# Patient Record
Sex: Male | Born: 1985 | Race: Black or African American | Hispanic: No | Marital: Single | Smoking: Never smoker
Health system: Southern US, Community
[De-identification: ages and names within clinical notes are randomized; demographics above are authoritative.]

## PROBLEM LIST (undated history)

## (undated) DIAGNOSIS — J45909 Unspecified asthma, uncomplicated: Secondary | ICD-10-CM

---

## 2015-01-08 ENCOUNTER — Encounter (HOSPITAL_COMMUNITY): Payer: Self-pay

## 2015-01-08 ENCOUNTER — Emergency Department (HOSPITAL_COMMUNITY)
Admission: EM | Admit: 2015-01-08 | Discharge: 2015-01-08 | Disposition: A | Discharge status: Home / Self Care | Payer: Medicaid - Out of State | Attending: Emergency Medicine | Admitting: Emergency Medicine

## 2015-01-08 ENCOUNTER — Emergency Department (HOSPITAL_COMMUNITY): Payer: Medicaid - Out of State

## 2015-01-08 DIAGNOSIS — R1031 Right lower quadrant pain: Secondary | ICD-10-CM | POA: Diagnosis present

## 2015-01-08 DIAGNOSIS — N451 Epididymitis: Secondary | ICD-10-CM

## 2015-01-08 DIAGNOSIS — J45909 Unspecified asthma, uncomplicated: Secondary | ICD-10-CM | POA: Diagnosis not present

## 2015-01-08 HISTORY — DX: Unspecified asthma, uncomplicated: J45.909

## 2015-01-08 LAB — URINALYSIS, ROUTINE W REFLEX MICROSCOPIC
Bilirubin Urine: NEGATIVE
GLUCOSE, UA: NEGATIVE mg/dL
HGB URINE DIPSTICK: NEGATIVE
Ketones, ur: NEGATIVE mg/dL
LEUKOCYTES UA: NEGATIVE
Nitrite: NEGATIVE
PH: 5.5 (ref 5.0–8.0)
PROTEIN: NEGATIVE mg/dL
SPECIFIC GRAVITY, URINE: 1.017 (ref 1.005–1.030)
Urobilinogen, UA: 1 mg/dL (ref 0.0–1.0)

## 2015-01-08 MED ORDER — IBUPROFEN 800 MG PO TABS: 800.0000 mg | ORAL_TABLET | Freq: Three times a day (TID) | ORAL | Status: AC

## 2015-01-08 MED ORDER — LIDOCAINE HCL (PF) 1 % IJ SOLN
INTRAMUSCULAR | Status: AC
  Filled 2015-01-08: qty 5

## 2015-01-08 MED ORDER — OXYCODONE-ACETAMINOPHEN 5-325 MG PO TABS
1.0000 | ORAL_TABLET | Freq: Once | ORAL | Status: AC
  Administered 2015-01-08: 1 via ORAL
  Filled 2015-01-08: qty 1

## 2015-01-08 MED ORDER — CEFTRIAXONE SODIUM 250 MG IJ SOLR
250.0000 mg | Freq: Once | INTRAMUSCULAR | Status: AC
  Administered 2015-01-08: 250 mg via INTRAMUSCULAR
  Filled 2015-01-08: qty 250

## 2015-01-08 MED ORDER — HYDROCODONE-ACETAMINOPHEN 5-325 MG PO TABS: 2.0000 | ORAL_TABLET | ORAL | Status: DC | PRN

## 2015-01-08 MED ORDER — DOXYCYCLINE HYCLATE 100 MG PO CAPS: 100.0000 mg | ORAL_CAPSULE | Freq: Two times a day (BID) | ORAL | Status: AC

## 2015-01-08 NOTE — ED Notes (Signed)
Pt in Ultrasound

## 2015-01-08 NOTE — Discharge Instructions (Signed)
Epididymitis °Epididymitis is swelling (inflammation) of the epididymis. The epididymis is a cord-like structure that is located along the top and back part of the testicle. It collects and stores sperm from the testicle. °This condition can also cause pain and swelling of the testicle and scrotum. Symptoms usually start suddenly (acute epididymitis). Sometimes epididymitis starts gradually and lasts for a while (chronic epididymitis). This type may be harder to treat. °CAUSES °In men 35 and younger, this condition is usually caused by a bacterial infection or sexually transmitted disease (STD), such as: °· Gonorrhea. °· Chlamydia.   °In men 35 and older who do not have anal sex, this condition is usually caused by bacteria from a blockage or abnormalities in the urinary system. These can result from: °· Having a tube placed into the bladder (urinary catheter). °· Having an enlarged or inflamed prostate gland. °· Having recent urinary tract surgery. °In men who have a condition that weakens the body's defense system (immune system), such as HIV, this condition can be caused by:  °· Other bacteria, including tuberculosis and syphilis. °· Viruses. °· Fungi. °Sometimes this condition occurs without infection. That may happen if urine flows backward into the epididymis after heavy lifting or straining. °RISK FACTORS °This condition is more likely to develop in men: °· Who have unprotected sex with more than one partner. °· Who have anal sex.   °· Who have recently had surgery.   °· Who have a urinary catheter. °· Who have urinary problems. °· Who have a suppressed immune system.   °SYMPTOMS  °This condition usually begins suddenly with chills, fever, and pain behind the scrotum and in the testicle. Other symptoms include:  °· Swelling of the scrotum, testicle, or both. °· Pain when ejaculating or urinating. °· Pain in the back or belly. °· Nausea. °· Itching and discharge from the penis. °· Frequent need to pass  urine. °· Redness and tenderness of the scrotum. °DIAGNOSIS °Your health care provider can diagnose this condition based on your symptoms and medical history. Your health care provider will also do a physical exam to ask about your symptoms and check your scrotum and testicle for swelling, pain, and redness. You may also have other tests, including:   °· Examination of discharge from the penis. °· Urine tests for infections, such as STDs.   °Your health care provider may test you for other STDs, including HIV.  °TREATMENT °Treatment for this condition depends on the cause. If your condition is caused by a bacterial infection, oral antibiotic medicine may be prescribed. If the bacterial infection has spread to your blood, you may need to receive IV antibiotics. Nonbacterial epididymitis is treated with home care that includes bed rest and elevation of the scrotum. °Surgery may be needed to treat: °· Bacterial epididymitis that causes pus to build up in the scrotum (abscess). °· Chronic epididymitis that has not responded to other treatments. °HOME CARE INSTRUCTIONS °Medicines  °· Take over-the-counter and prescription medicines only as told by your health care provider.   °· If you were prescribed an antibiotic medicine, take it as told by your health care provider. Do not stop taking the antibiotic even if your condition improves. °Sexual Activity  °· If your epididymitis was caused by an STD, avoid sexual activity until your treatment is complete. °· Inform your sexual partner or partners if you test positive for an STD. They may need to be treated. Do not engage in sexual activity with your partner or partners until their treatment is completed. °General Instructions  °· Return to your normal activities as told   by your health care provider. Ask your health care provider what activities are safe for you. °· Keep your scrotum elevated and supported while resting. Ask your health care provider if you should wear a  scrotal support, such as a jockstrap. Wear it as told by your health care provider. °· If directed, apply ice to the affected area:   °¨ Put ice in a plastic bag. °¨ Place a towel between your skin and the bag. °¨ Leave the ice on for 20 minutes, 2-3 times per day. °· Try taking a sitz bath to help with discomfort. This is a warm water bath that is taken while you are sitting down. The water should only come up to your hips and should cover your buttocks. Do this 3-4 times per day or as told by your health care provider. °· Keep all follow-up visits as told by your health care provider. This is important. °SEEK MEDICAL CARE IF:  °· You have a fever.   °· Your pain medicine is not helping.   °· Your pain is getting worse.   °· Your symptoms do not improve within three days. °  °This information is not intended to replace advice given to you by your health care provider. Make sure you discuss any questions you have with your health care provider. °  °Document Released: 02/18/2000 Document Revised: 11/11/2014 Document Reviewed: 07/08/2014 °Elsevier Interactive Patient Education ©2016 [employer] ° °

## 2015-01-08 NOTE — ED Notes (Signed)
Pt reports groin pain since yesterday. Pain originates at lower mid abdomen and radiates down to his groin. Pt denies hematuria or dysuria.

## 2015-01-08 NOTE — ED Provider Notes (Signed)
CSN: [id number]     Arrival date & time 01/08/15  0015 History   By signing my name below, I, Jarvis Morganaylor Ferguson, attest that this documentation has been prepared under the direction and in the presence of Gilda Creasehristopher J Geraldina Parrott, MD. Electronically Signed: Jarvis Morganaylor Ferguson, ED Scribe. 01/08/2015. 3:32 AM.    Chief Complaint  Patient presents with  . Groin Pain    The history is provided by the patient. No language interpreter was used.    HPI Comments: Steven Gaines is a 29 y.o. male who presents to the Emergency Department complaining of intermittent,  Moderate, right sided groin pain onset 2 days. Pt reports associated right testicular pain. He has not taken any medications PTA. Pt reports he feels relief with urination.  Pt notes he had a similar pain 3-4 years ago and went to an ER while in [place] and imaging done which showed no acute findings. He denies any nausea, vomiting, diarrhea, constipation, dysuria, hematuria, urinary frequency, urinary urgency, testicular swelling, penile pain or penile discharge.   Past Medical History  Diagnosis Date  . Asthma    History reviewed. No pertinent past surgical history. No family history on file. Social History  Substance Use Topics  . Smoking status: Never Smoker   . Smokeless tobacco: None  . Alcohol Use: No    Review of Systems  Gastrointestinal: Negative for vomiting, diarrhea and constipation.  Genitourinary: Positive for testicular pain. Negative for dysuria, urgency, frequency, hematuria, discharge, penile swelling, scrotal swelling, difficulty urinating and penile pain.  All other systems reviewed and are negative.     Allergies  Review of patient's allergies indicates no known allergies.  Home Medications   Prior to Admission medications   Not on File   Triage Vitals: BP 135/77 mmHg  Pulse 63  Temp(Src) 97.8 F (36.6 C) (Oral)  Resp 18  Ht 5\' 8"  (1.727 m)  Wt 147 lb (66.679 kg)  BMI 22.36 kg/m2  SpO2 96%  Physical  Exam  Constitutional: He is oriented to person, place, and time. He appears well-developed and well-nourished. No distress.  HENT:  Head: Normocephalic and atraumatic.  Right Ear: Hearing normal.  Left Ear: Hearing normal.  Nose: Nose normal.  Mouth/Throat: Oropharynx is clear and moist and mucous membranes are normal.  Eyes: Conjunctivae and EOM are normal. Pupils are equal, round, and reactive to light.  Neck: Normal range of motion. Neck supple.  Cardiovascular: Regular rhythm, S1 normal and S2 normal.  Exam reveals no gallop and no friction rub.   No murmur heard. Pulmonary/Chest: Effort normal and breath sounds normal. No respiratory distress. He exhibits no tenderness.  Abdominal: Soft. Normal appearance and bowel sounds are normal. There is no hepatosplenomegaly. There is no tenderness. There is no rebound, no guarding, no tenderness at McBurney's point and negative Murphy's sign. No hernia.  Genitourinary: Right testis shows tenderness. Right testis shows no mass and no swelling.  Tenderness of upper pole of right testicle w/o mass or swelling  Musculoskeletal: Normal range of motion.  Neurological: He is alert and oriented to person, place, and time. He has normal strength. No cranial nerve deficit or sensory deficit. Coordination normal. GCS eye subscore is 4. GCS verbal subscore is 5. GCS motor subscore is 6.  Skin: Skin is warm, dry and intact. No rash noted. No cyanosis.  Psychiatric: He has a normal mood and affect. His speech is normal and behavior is normal. Thought content normal.  Nursing note and vitals reviewed.  ED Course  Procedures (including critical care time)  DIAGNOSTIC STUDIES: Oxygen Saturation is 96% on RA, normal by my interpretation.    COORDINATION OF CARE:    Labs Review Labs Reviewed  URINALYSIS, ROUTINE W REFLEX MICROSCOPIC (NOT AT [facility])    Imaging Review US Scrotum  01/08/2015  CLINICAL DATA:  RIGHT testicle pain beginning yesterday. EXAM:  SCROTAL ULTRASOUND DOPPLER ULTRASOUND OF THE TESTICLES TECHNIQUE: Complete ultrasound examination of the testicles, epididymis, and other scrotal structures was performed. Color and spectral Doppler ultrasound were also utilized to evaluate blood flow to the testicles. COMPARISON:  None. FINDINGS: Right testicle Measurements: 4 x 1.6 x 2.4 cm. No mass or microlithiasis visualized. Left testicle Measurements: 2.5 x 1.9 x 1.9 cm. No mass or microlithiasis visualized. Right epididymis: RIGHT epididymal head appears normal, however the body appears enlarged and heterogeneous though not fully interrogated with color flow. Left epididymis:  Not sonographically identified. Hydrocele:  None visualized. Varicocele:  LEFT probable varicocele. Pulsed Doppler interrogation of both testes demonstrates normal low resistance arterial and venous waveforms bilaterally. IMPRESSION: Suspected RIGHT epididymitis without orchitis. LEFT epididymis was not sonographically identified, however hyperemic presumed varicocele may actually represent LEFT epididymitis without orchitis. Electronically Signed   By: Awilda Metro M.D.   On: 01/08/2015 03:12   [place] Art/ven Flow Abd Pelv Doppler  01/08/2015  CLINICAL DATA:  RIGHT testicle pain beginning yesterday. EXAM: SCROTAL ULTRASOUND DOPPLER ULTRASOUND OF THE TESTICLES TECHNIQUE: Complete ultrasound examination of the testicles, epididymis, and other scrotal structures was performed. Color and spectral Doppler ultrasound were also utilized to evaluate blood flow to the testicles. COMPARISON:  None. FINDINGS: Right testicle Measurements: 4 x 1.6 x 2.4 cm. No mass or microlithiasis visualized. Left testicle Measurements: 2.5 x 1.9 x 1.9 cm. No mass or microlithiasis visualized. Right epididymis: RIGHT epididymal head appears normal, however the body appears enlarged and heterogeneous though not fully interrogated with color flow. Left epididymis:  Not sonographically identified. Hydrocele:   None visualized. Varicocele:  LEFT probable varicocele. Pulsed Doppler interrogation of both testes demonstrates normal low resistance arterial and venous waveforms bilaterally. IMPRESSION: Suspected RIGHT epididymitis without orchitis. LEFT epididymis was not sonographically identified, however hyperemic presumed varicocele may actually represent LEFT epididymitis without orchitis. Electronically Signed   By: Awilda Metro M.D.   On: 01/08/2015 03:12   I have personally reviewed and evaluated these images and lab results as part of my medical decision-making.   EKG Interpretation None      MDM   Final diagnoses:  None   epididymitis  Patient presents to the ER for evaluation of groin pain. Patient complaining of pain in the right testicle which began yesterday. Patient does have tenderness in the upper area of the testicle near the epididymis. There are no external lesions noted. Patient does not have any urethral discharge. Ultrasound does not show any evidence of torsion, does suggest evidence of epididymitis without orchitis. Patient treated with Rocephin and doxycycline, analgesia.    Gilda Crease, MD 01/08/15 [phone]

## 2015-01-11 ENCOUNTER — Encounter (HOSPITAL_COMMUNITY): Payer: Self-pay | Admitting: Emergency Medicine

## 2015-01-11 DIAGNOSIS — J45909 Unspecified asthma, uncomplicated: Secondary | ICD-10-CM | POA: Insufficient documentation

## 2015-01-11 DIAGNOSIS — K5900 Constipation, unspecified: Secondary | ICD-10-CM | POA: Insufficient documentation

## 2015-01-11 DIAGNOSIS — Z792 Long term (current) use of antibiotics: Secondary | ICD-10-CM | POA: Insufficient documentation

## 2015-01-11 LAB — URINALYSIS, ROUTINE W REFLEX MICROSCOPIC
Bilirubin Urine: NEGATIVE
Glucose, UA: NEGATIVE mg/dL
Hgb urine dipstick: NEGATIVE
Ketones, ur: 40 mg/dL — AB
Leukocytes, UA: NEGATIVE
Nitrite: NEGATIVE
Protein, ur: NEGATIVE mg/dL
Specific Gravity, Urine: 1.024 (ref 1.005–1.030)
Urobilinogen, UA: 0.2 mg/dL (ref 0.0–1.0)
pH: 5.5 (ref 5.0–8.0)

## 2015-01-11 NOTE — ED Notes (Signed)
Patient here with continued abdominal pain. Was seen for testicle pain 3 days ago, and was discharged with ABX. Endorses mild abdominal pain at last visit, but tonight pain is worsened. Testicle pain has resolved at this time. Denies fever and dysuria.

## 2015-01-12 ENCOUNTER — Emergency Department (HOSPITAL_COMMUNITY): Payer: No Typology Code available for payment source

## 2015-01-12 ENCOUNTER — Emergency Department (HOSPITAL_COMMUNITY)
Admission: EM | Admit: 2015-01-12 | Discharge: 2015-01-12 | Disposition: A | Discharge status: Home / Self Care | Payer: No Typology Code available for payment source | Attending: Emergency Medicine | Admitting: Emergency Medicine

## 2015-01-12 DIAGNOSIS — R109 Unspecified abdominal pain: Secondary | ICD-10-CM

## 2015-01-12 DIAGNOSIS — K5900 Constipation, unspecified: Secondary | ICD-10-CM

## 2015-01-12 LAB — COMPREHENSIVE METABOLIC PANEL
ALT: 9 U/L — ABNORMAL LOW (ref 17–63)
AST: 17 U/L (ref 15–41)
Albumin: 4.1 g/dL (ref 3.5–5.0)
Alkaline Phosphatase: 55 U/L (ref 38–126)
Anion gap: 11 (ref 5–15)
BUN: 12 mg/dL (ref 6–20)
CO2: 22 mmol/L (ref 22–32)
Calcium: 9.9 mg/dL (ref 8.9–10.3)
Chloride: 109 mmol/L (ref 101–111)
Creatinine, Ser: 0.99 mg/dL (ref 0.61–1.24)
GFR calc Af Amer: >60 mL/min (ref >60)
GFR calc non Af Amer: >60 mL/min (ref >60)
Glucose, Bld: 85 mg/dL (ref 65–99)
Potassium: 3.8 mmol/L (ref 3.5–5.1)
Sodium: 142 mmol/L (ref 135–145)
Total Bilirubin: 1 mg/dL (ref 0.3–1.2)
Total Protein: 7.7 g/dL (ref 6.5–8.1)

## 2015-01-12 LAB — CBC
HCT: 40.2 % (ref 39.0–52.0)
Hemoglobin: 14 g/dL (ref 13.0–17.0)
MCH: 28 pg (ref 26.0–34.0)
MCHC: 34.8 g/dL (ref 30.0–36.0)
MCV: 80.4 fL (ref 78.0–100.0)
Platelets: 238 10*3/uL (ref 150–400)
RBC: 5 MIL/uL (ref 4.22–5.81)
RDW: 13.1 % (ref 11.5–15.5)
WBC: 5.8 10*3/uL (ref 4.0–10.5)

## 2015-01-12 LAB — LIPASE, BLOOD: Lipase: 19 U/L (ref 11–51)

## 2015-01-12 MED ORDER — FENTANYL CITRATE (PF) 100 MCG/2ML IJ SOLN
50.0000 ug | Freq: Once | INTRAMUSCULAR | Status: AC
  Administered 2015-01-12: 50 ug via INTRAVENOUS
  Filled 2015-01-12: qty 2

## 2015-01-12 MED ORDER — BISACODYL 5 MG PO TBEC
5.0000 mg | DELAYED_RELEASE_TABLET | Freq: Once | ORAL | Status: AC
Start: 2015-01-12 — End: 2015-01-12
  Administered 2015-01-12: 5 mg via ORAL
  Filled 2015-01-12: qty 1

## 2015-01-12 MED ORDER — ONDANSETRON HCL 4 MG/2ML IJ SOLN
4.0000 mg | Freq: Once | INTRAMUSCULAR | Status: AC
  Administered 2015-01-12: 4 mg via INTRAVENOUS
  Filled 2015-01-12: qty 2

## 2015-01-12 MED ORDER — GLYCERIN (LAXATIVE) 2 G RE SUPP: 1.0000 | Freq: Once | RECTAL | Status: AC

## 2015-01-12 MED ORDER — DOCUSATE SODIUM 100 MG PO CAPS: 100.0000 mg | ORAL_CAPSULE | Freq: Two times a day (BID) | ORAL | Status: AC

## 2015-01-12 MED ORDER — SODIUM CHLORIDE 0.9 % IV BOLUS (SEPSIS)
1000.0000 mL | Freq: Once | INTRAVENOUS | Status: AC
  Administered 2015-01-12: 1000 mL via INTRAVENOUS

## 2015-01-12 NOTE — Discharge Instructions (Signed)

## 2015-01-12 NOTE — ED Provider Notes (Signed)
CSN: [id number]     Arrival date & time 01/11/15  2146 History   First MD Initiated Contact with Patient 01/12/15 0107     Chief Complaint  Patient presents with  . Abdominal Pain     (Consider location/radiation/quality/duration/timing/severity/associated sxs/prior Treatment) HPI   PCP: No primary care provider on file. PMH: no significant PMH  Steven Gaines is a 29 y.o.  male seen here on 11/4 and diagnosed with epididymitis. He was given Rocephin and doxy and discharged. He was also given rx for Vicodin. He reports constipation since his visit and now has developed diffuse crampy abdominal pain worse in the lower quadrants. He describes it has intermittent sharp pains.  He has had no diaphoresis, fever, headache, weakness (general or focal), confusion, change of vision,  dysphagia, aphagia, shortness of breath, nausea, vomiting, diarrhea, lower extremity swelling, rash, neck pain, chest pain. Pt sitting comfortable and is well appearing. He has had complete resolution of testicular pain since his visit.   Past Medical History  Diagnosis Date  . Asthma    History reviewed. No pertinent past surgical history. History reviewed. No pertinent family history. Social History  Substance Use Topics  . Smoking status: Never Smoker   . Smokeless tobacco: None  . Alcohol Use: No    Review of Systems  ROS: See HPI Constitutional: no fever  Eyes: no drainage  ENT: no runny nose  Cardiovascular: no chest pain  Resp: no SOB  GI: no vomiting GU: no dysuria Integumentary: no rash  Allergy: no hives  Musculoskeletal: no leg swelling  Neurological: no slurred speech ROS otherwise negative   Allergies  Review of patient's allergies indicates no known allergies.  Home Medications   Prior to Admission medications   Medication Sig Start Date End Date Taking? Authorizing Provider  doxycycline (VIBRAMYCIN) 100 MG capsule Take 1 capsule (100 mg total) by mouth 2 (two) times  daily. 01/08/15  Yes Gilda Creasehristopher J Pollina, MD  ibuprofen (ADVIL,MOTRIN) 800 MG tablet Take 1 tablet (800 mg total) by mouth 3 (three) times daily. 01/08/15  Yes Gilda Creasehristopher J Pollina, MD  docusate sodium (COLACE) 100 MG capsule Take 1 capsule (100 mg total) by mouth every 12 (twelve) hours. 01/12/15   Marlon Peliffany Evelyn Moch, PA-C  glycerin adult (GLYCERIN ADULT) 2 G SUPP Place 1 suppository rectally once. 01/12/15   Glenn Christo Neva SeatGreene, PA-C   BP 122/76 mmHg  Pulse 60  Temp(Src) 98.4 F (36.9 C) (Oral)  Resp 17  Ht 5\' 8"  (1.727 m)  Wt 140 lb 3 oz (63.589 kg)  BMI 21.32 kg/m2  SpO2 100% Physical Exam  Constitutional: He appears well-developed and well-nourished. No distress.  HENT:  Head: Normocephalic and atraumatic.  Eyes: Pupils are equal, round, and reactive to light.  Neck: Normal range of motion. Neck supple.  Cardiovascular: Normal rate and regular rhythm.   Pulmonary/Chest: Effort normal.  Abdominal: Soft. Bowel sounds are normal. He exhibits no distension. There is tenderness (pt voices that he has diffuse tenderness when I palpate his abdomen but does not appear to be uncomfortable. No localization of pain.). There is no rigidity, no rebound, no guarding and no CVA tenderness.  Neurological: He is alert.  Skin: Skin is warm and dry.  Nursing note and vitals reviewed.   ED Course  Procedures (including critical care time) Labs Review Labs Reviewed  COMPREHENSIVE METABOLIC PANEL - Abnormal; Notable for the following:    ALT 9 (*)    All other components within normal limits  URINALYSIS,  ROUTINE W REFLEX MICROSCOPIC (NOT AT [facility]) - Abnormal; Notable for the following:    Ketones, ur 40 (*)    All other components within normal limits  LIPASE, BLOOD  CBC    Imaging Review No results found. I have personally reviewed and evaluated these images and lab results as part of my medical decision-making.   EKG Interpretation None      MDM   Final diagnoses:  Constipation   Abdominal pain   Pt comes to the ER complaining of diffuse, mild/moderate abdominal pain and constipation since his previous visit to the ER in which he was diagnosed with epididymitis and given Rocephin, doxy and pain medications.  Since then his testicular pain has resolved. He has not had any diarrhea, fevers, vomiting and his belly pain is diffuse mild/moderate with intermittent sharp pains.  His blood work is reassuring, normal CBC, CMP, Lipase and Urinalysis. No localization of pain to RUQ or RLQ. 2 view of the abdomen shows small to moderate colonic stool.   At this time, will treat patient as constipation with stool softners/laxatives and give strict return precautions. His pain has completely resolved here in the ED with very small amount of pain medications. He will need to be re-evaluated if his pain returns or his symptoms change.  Medications  fentaNYL (SUBLIMAZE) injection 50 mcg (50 mcg Intravenous Given 01/12/15 0200)  ondansetron (ZOFRAN) injection 4 mg (4 mg Intravenous Given 01/12/15 0200)  sodium chloride 0.9 % bolus 1,000 mL (0 mLs Intravenous Stopped 01/12/15 0301)  bisacodyl (DULCOLAX) EC tablet 5 mg (5 mg Oral Given 01/12/15 0508)    29 y.o.[name]'s medical screening exam was performed and I feel the patient has had an appropriate workup for their chief complaint at this time and likelihood of emergent condition existing is low. They have been counseled on decision, discharge, follow up and which symptoms necessitate immediate return to the emergency department. They or their family verbally stated understanding and agreement with plan and discharged in stable condition.   Vital signs are stable at discharge. Filed Vitals:   01/12/15 0453  BP: 122/76  Pulse: 60  Temp: 98.4 F (36.9 C)  Resp: [address], PA-C 01/15/15 1325  Zadie Rhine, MD 01/15/15 1330

## 2015-01-12 NOTE — ED Notes (Signed)
Awaiting dulcolax from pharmacy

## 2015-12-25 ENCOUNTER — Emergency Department (HOSPITAL_COMMUNITY)
Admission: EM | Admit: 2015-12-25 | Discharge: 2015-12-25 | Disposition: A | Discharge status: Home / Self Care | Payer: Medicaid - Out of State | Attending: Emergency Medicine | Admitting: Emergency Medicine

## 2015-12-25 ENCOUNTER — Encounter (HOSPITAL_COMMUNITY): Payer: Self-pay | Admitting: *Deleted

## 2015-12-25 DIAGNOSIS — J45909 Unspecified asthma, uncomplicated: Secondary | ICD-10-CM | POA: Insufficient documentation

## 2015-12-25 DIAGNOSIS — Z202 Contact with and (suspected) exposure to infections with a predominantly sexual mode of transmission: Secondary | ICD-10-CM | POA: Insufficient documentation

## 2015-12-25 MED ORDER — METRONIDAZOLE 500 MG PO TABS
2000.0000 mg | ORAL_TABLET | Freq: Once | ORAL | Status: AC
  Administered 2015-12-25: 2000 mg via ORAL
  Filled 2015-12-25: qty 4

## 2015-12-25 MED ORDER — STERILE WATER FOR INJECTION IJ SOLN
10.0000 mL | Freq: Once | INTRAMUSCULAR | Status: AC
  Administered 2015-12-25: 10 mL via INTRAMUSCULAR
  Filled 2015-12-25: qty 10

## 2015-12-25 MED ORDER — AZITHROMYCIN 250 MG PO TABS
1000.0000 mg | ORAL_TABLET | Freq: Once | ORAL | Status: AC
  Administered 2015-12-25: 1000 mg via ORAL
  Filled 2015-12-25: qty 4

## 2015-12-25 MED ORDER — CEFTRIAXONE SODIUM 250 MG IJ SOLR
250.0000 mg | Freq: Once | INTRAMUSCULAR | Status: AC
  Administered 2015-12-25: 250 mg via INTRAMUSCULAR
  Filled 2015-12-25: qty 250

## 2015-12-25 NOTE — ED Triage Notes (Signed)
Pt was told by partner she was postive for STD. Pt denies any SX's at this time.

## 2015-12-25 NOTE — Discharge Instructions (Signed)
Return here as needed.  Follow-up with primary care doctor or an urgent care °

## 2015-12-25 NOTE — ED Notes (Signed)
Declined W/C at D/C and was escorted to lobby by RN. 

## 2015-12-25 NOTE — ED Provider Notes (Signed)
[facility]-EMERGENCY DEPT Provider Note   CSN: [id number] Arrival date & time: 12/25/15  1106  By signing my name below, I, Avnee Patel, attest that this documentation has been prepared under the direction and in the presence of  BoeingChris Mariesha Venturella, PA-C. Electronically Signed: Clovis PuAvnee Patel, ED Scribe. 12/25/15. 11:32 AM.   History   Chief Complaint Chief Complaint  Patient presents with  . Exposure to STD    The history is provided by the patient. No language interpreter was used.   HPI Comments:  Steven Gaines is a 30 y.o. male who presents to the Emergency Department complaining of exposure to STD. Pt states his fiance/sexaul partner has been diagnosed and treated for chlamydia. No alleviating factors noted. Pt denies any symptoms or other complaints at this time.    Past Medical History:  Diagnosis Date  . Asthma     There are no active problems to display for this patient.   History reviewed. No pertinent surgical history.   Home Medications    Prior to Admission medications   Medication Sig Start Date End Date Taking? Authorizing Provider  docusate sodium (COLACE) 100 MG capsule Take 1 capsule (100 mg total) by mouth every 12 (twelve) hours. 01/12/15   Tiffany Neva SeatGreene, PA-C  doxycycline (VIBRAMYCIN) 100 MG capsule Take 1 capsule (100 mg total) by mouth 2 (two) times daily. 01/08/15   Gilda Creasehristopher J Pollina, MD  glycerin adult (GLYCERIN ADULT) 2 G SUPP Place 1 suppository rectally once. 01/12/15   Tiffany Neva SeatGreene, PA-C  ibuprofen (ADVIL,MOTRIN) 800 MG tablet Take 1 tablet (800 mg total) by mouth 3 (three) times daily. 01/08/15   Gilda Creasehristopher J Pollina, MD    Family History History reviewed. No pertinent family history.  Social History Social History  Substance Use Topics  . Smoking status: Never Smoker  . Smokeless tobacco: Never Used  . Alcohol use No     Allergies   Review of patient's allergies indicates no known allergies.   Review of Systems Review of Systems    Genitourinary: Negative for discharge and penile pain.     Physical Exam Updated Vital Signs BP 114/74 (BP Location: Left Arm)   Pulse 62   Temp 97.8 F (36.6 C) (Oral)   Resp 20   Ht 5\' 8"  (1.727 m)   Wt 155 lb 8 oz (70.5 kg)   SpO2 98%   BMI 23.64 kg/m   Physical Exam  Constitutional: He is oriented to person, place, and time. He appears well-developed and well-nourished.  HENT:  Head: Normocephalic and atraumatic.  Eyes: Pupils are equal, round, and reactive to light.  Pulmonary/Chest: Effort normal.  Genitourinary:  Genitourinary Comments: Patient differs genital exam stating he does not have any discharge.   Neurological: He is alert and oriented to person, place, and time.  Skin: Skin is warm and dry.  Psychiatric: He has a normal mood and affect.  Nursing note and vitals reviewed.    ED Treatments / Results  DIAGNOSTIC STUDIES:  Oxygen Saturation is 98% on RA, normal by my interpretation.    COORDINATION OF CARE:  11:31 AM Discussed treatment plan with pt at bedside and pt agreed to plan.  Labs (all labs ordered are listed, but only abnormal results are displayed) Labs Reviewed - No data to display  EKG  EKG Interpretation None       Radiology No results found.  Procedures Procedures (including critical care time)  Medications Ordered in ED Medications - No data to display  Initial Impression / Assessment and Plan / ED Course  I have reviewed the triage vital signs and the nursing notes.  Pertinent labs & imaging results that were available during my care of the patient were reviewed by me and considered in my medical decision making (see chart for details).  Clinical Course   Pt arrives for asymptomatic STD check. Discussed safe sexual practices. Pt is advised to follow up for free testing at local health department in the future. Pt appears safe for discharge.   Final Clinical Impressions(s) / ED Diagnoses   Final diagnoses:  None     New Prescriptions New Prescriptions   No medications on file  I personally performed the services described in this documentation, which was scribed in my presence. The recorded information has been reviewed and is accurate.     Charlestine Night, PA-C 12/25/15 1215    Jacalyn Lefevre, MD 12/25/15 [phone]

## 2017-10-10 IMAGING — DX DG ABDOMEN 2V
2 series · 2 of 2 positions shown · non-contrast
Comparison: None.

CLINICAL DATA: Right lower quadrant abdominal pain. Constipation.
Symptoms today.

EXAM:
ABDOMEN - 2 VIEW

[abdomen erect]
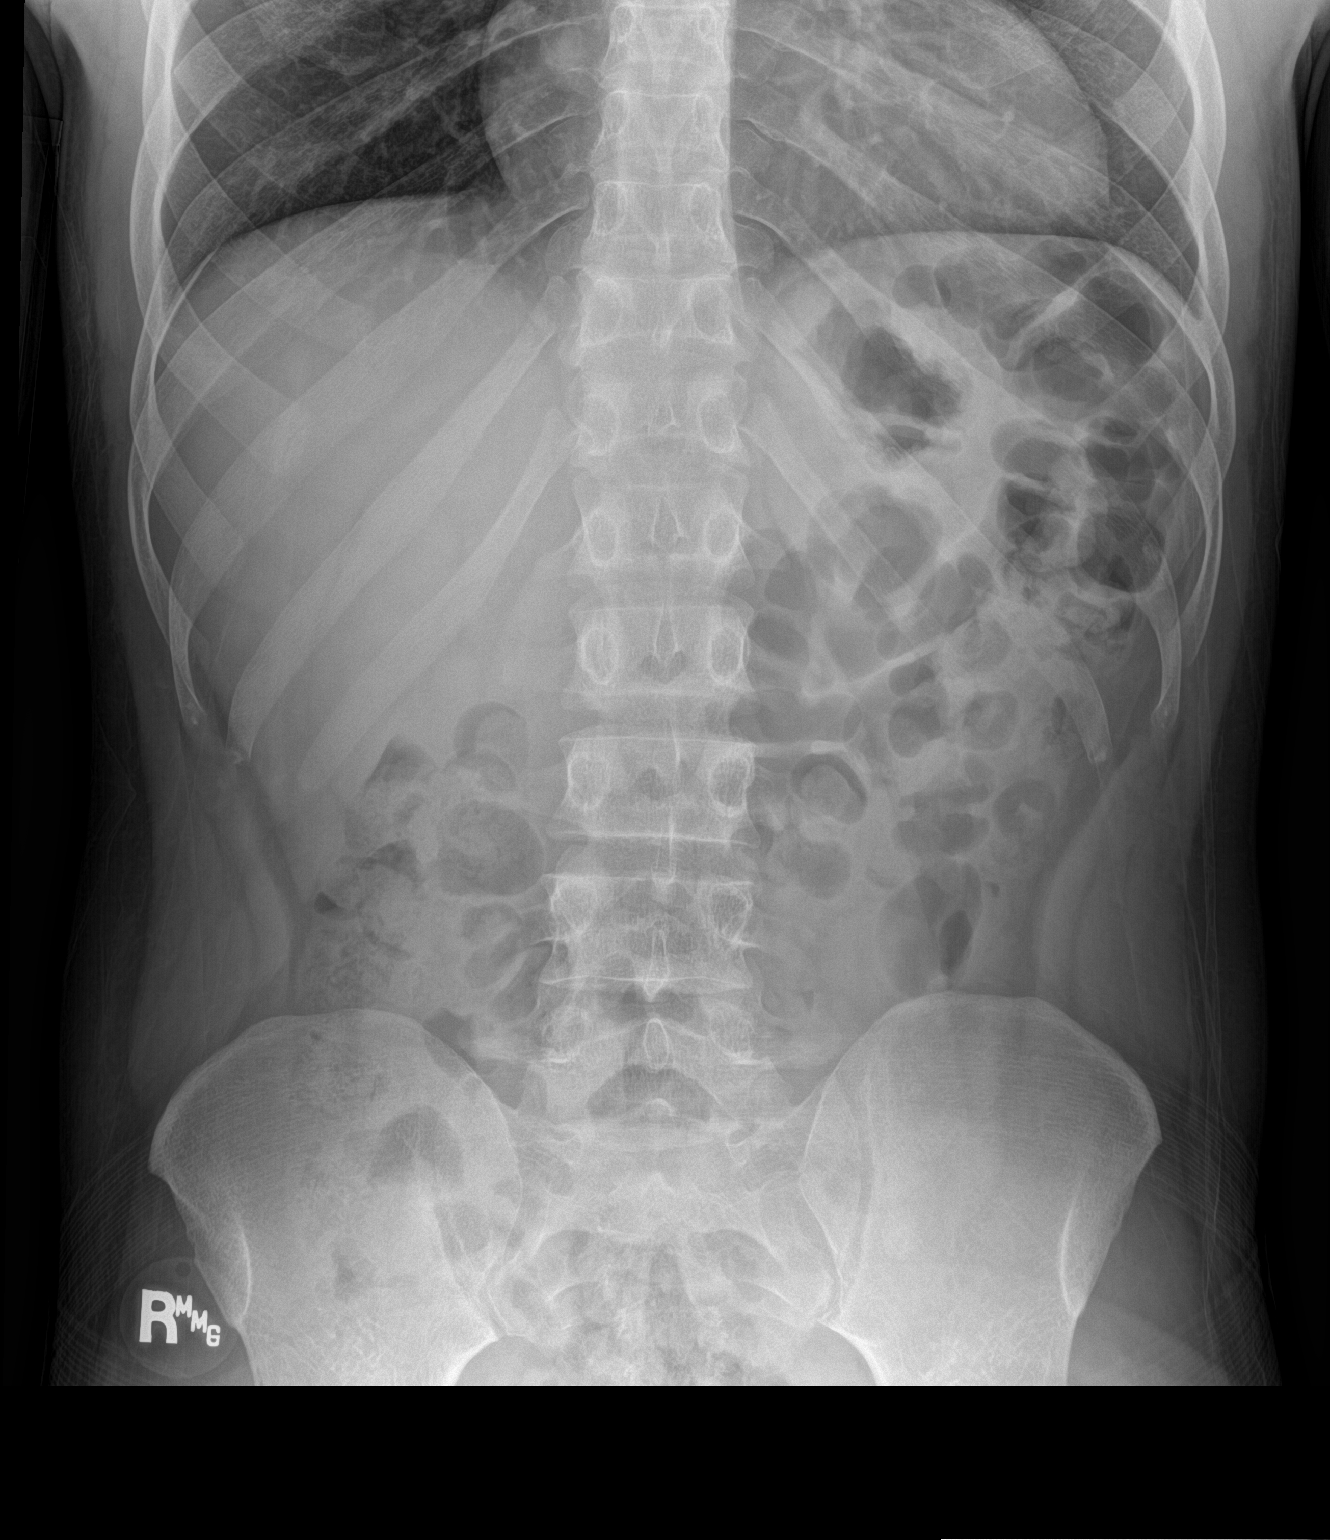

[abdomen supine]
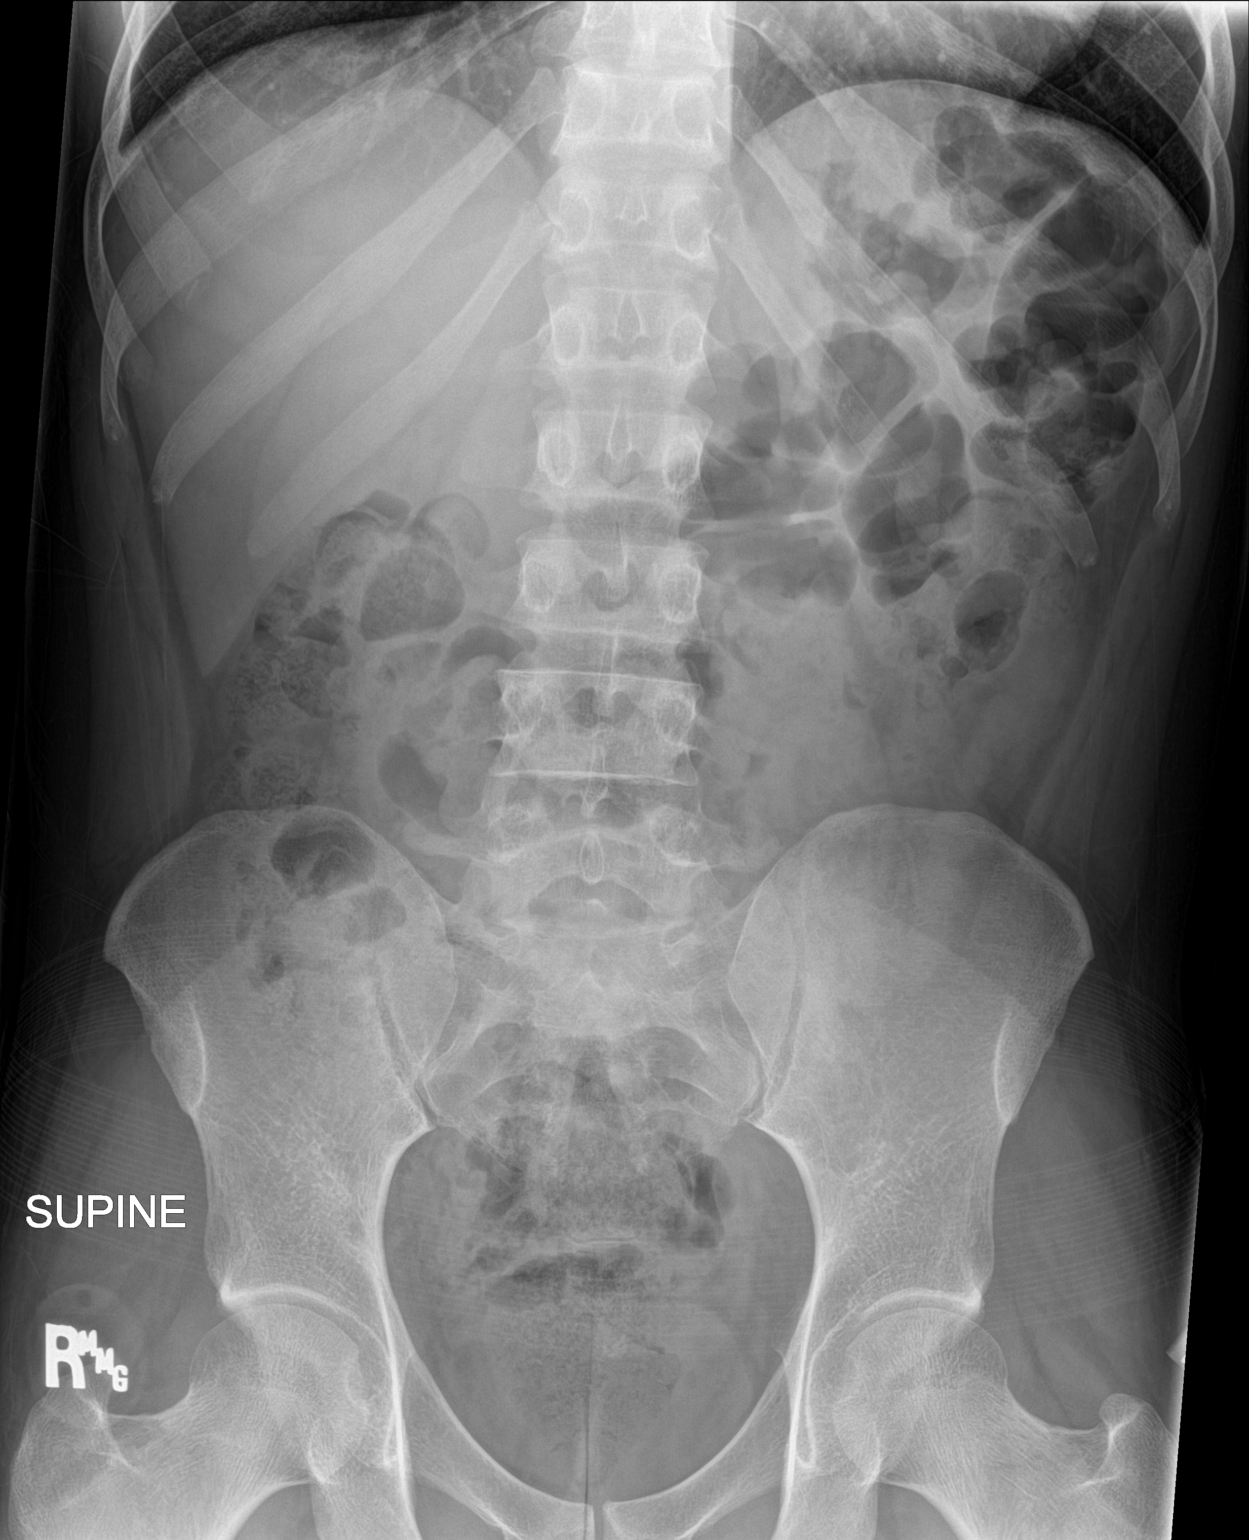

[2 of 2 positions shown; findings below may reference images not displayed]

FINDINGS: The bowel gas pattern is normal. There is no evidence of free air.
Small to moderate stool burden. No radio-opaque calculi. Lower lung
bases are clear. Heart appears prominent in size, partially
included. No osseous abnormality.
IMPRESSION: 1. Normal bowel gas pattern, small to moderate colonic stool.
2. Heart appears prominent in size, only partially included. PA and
lateral chest radiographs recommended for chest evaluation.

## 2019-06-14 ENCOUNTER — Ambulatory Visit: Payer: Medicaid - Out of State | Attending: Internal Medicine

## 2019-06-14 DIAGNOSIS — Z23 Encounter for immunization: Secondary | ICD-10-CM

## 2019-06-14 NOTE — Progress Notes (Signed)
   Cov[facility] Vaccination [facility]  Name:  Steven Gaines    MRN: [id number] DOB: 1985/09/24  06/14/2019  [name] was observed post Covid-19 immunization for 15 minutes without incident. He was provided with Vaccine Information Sheet and instruction to access the V-Safe system.   [name] was instructed to call 911 with any severe reactions post vaccine: Marland Kitchen Difficulty breathing  . Swelling of face and throat  . A fast heartbeat  . A bad rash all over body  . Dizziness and weakness   Immunizations Administered    Name Date Dose VIS Date Route   Pfizer COVID-19 Vaccine 06/14/2019 12:00 PM 0.3 mL 02/14/2019 Intramuscular   Manufacturer: [employer], [employer]   Lot: [id number]   NDC: [address]-5

## 2019-07-07 ENCOUNTER — Ambulatory Visit: Payer: Medicaid - Out of State | Attending: Internal Medicine

## 2019-07-07 DIAGNOSIS — Z23 Encounter for immunization: Secondary | ICD-10-CM

## 2019-07-07 NOTE — Progress Notes (Signed)
   Covid-19 Vaccination Clinic  Name:  Steven Gaines    MRN: [id number] DOB: 23-Jun-1985  07/07/2019  [name] was observed post Covid-19 immunization for 15 minutes without incident. He was provided with Vaccine Information Sheet and instruction to access the V-Safe system.   [name] was instructed to call 911 with any severe reactions post vaccine: Marland Kitchen Difficulty breathing  . Swelling of face and throat  . A fast heartbeat  . A bad rash all over body  . Dizziness and weakness   Immunizations Administered    Name Date Dose VIS Date Route   Pfizer COVID-19 Vaccine 07/07/2019 12:13 PM 0.3 mL 04/30/2018 Intramuscular   Manufacturer: [employer], [employer]   Lot: [id number]   NDC: [address]-1
